# Patient Record
Sex: Male | Born: 1965 | Race: Black or African American | Hispanic: No | Marital: Married | Smoking: Current every day smoker
Health system: Southern US, Community
[De-identification: ages and names within clinical notes are randomized; demographics above are authoritative.]

---

## 2003-04-03 ENCOUNTER — Ambulatory Visit (HOSPITAL_COMMUNITY): Admission: EM | Admit: 2003-04-03 | Discharge: 2003-04-04 | Payer: Self-pay | Admitting: Emergency Medicine

## 2003-04-03 ENCOUNTER — Encounter: Payer: Self-pay | Admitting: *Deleted

## 2003-04-04 ENCOUNTER — Encounter: Payer: Self-pay | Admitting: *Deleted

## 2012-02-16 ENCOUNTER — Telehealth: Payer: Self-pay | Admitting: *Deleted

## 2012-02-16 NOTE — Telephone Encounter (Deleted)
Wife called back stating they were discharged from the ER with erosive stomach ulcer as a diagnosis and to follow up with Dr. Russella Dar.  He had a normal CBC, and chest xray per wife.  They need to know if we need to refer him to Dr. Russella Dar from our office.  Would like to speak to [facility] or Dr. Tawanna Cooler.

## 2012-02-16 NOTE — Telephone Encounter (Signed)
This encounter is not correct. Please disregard.

## 2019-02-12 ENCOUNTER — Other Ambulatory Visit: Payer: Self-pay

## 2019-02-12 ENCOUNTER — Emergency Department (HOSPITAL_COMMUNITY)
Admission: EM | Admit: 2019-02-12 | Discharge: 2019-02-12 | Disposition: A | Discharge status: Home / Self Care | Payer: BC Managed Care – PPO | Attending: Emergency Medicine | Admitting: Emergency Medicine

## 2019-02-12 ENCOUNTER — Emergency Department (HOSPITAL_COMMUNITY): Payer: BC Managed Care – PPO

## 2019-02-12 ENCOUNTER — Encounter (HOSPITAL_COMMUNITY): Payer: Self-pay | Admitting: Emergency Medicine

## 2019-02-12 DIAGNOSIS — L03115 Cellulitis of right lower limb: Secondary | ICD-10-CM | POA: Insufficient documentation

## 2019-02-12 DIAGNOSIS — L0291 Cutaneous abscess, unspecified: Secondary | ICD-10-CM

## 2019-02-12 MED ORDER — DOXYCYCLINE HYCLATE 100 MG PO TABS: 100.0000 mg | ORAL_TABLET | Freq: Two times a day (BID) | ORAL | 0 refills | Status: DC

## 2019-02-12 MED ORDER — LIDOCAINE HCL (PF) 1 % IJ SOLN
5.0000 mL | Freq: Once | INTRAMUSCULAR | Status: AC
  Administered 2019-02-12: 5 mL
  Filled 2019-02-12: qty 5

## 2019-02-12 MED ORDER — NAPROXEN 500 MG PO TABS: 500.0000 mg | ORAL_TABLET | Freq: Two times a day (BID) | ORAL | 0 refills | Status: AC
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
## 2019-02-12 NOTE — Discharge Instructions (Addendum)
Take the antibiotics as prescribed, follow-up with the orthopedic doctor for further evaluation.  Soak your ankle several times a day to continue to promote drainage and wound healing
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
## 2019-08-14 ENCOUNTER — Encounter (HOSPITAL_COMMUNITY): Payer: Self-pay | Admitting: Emergency Medicine

## 2019-08-14 ENCOUNTER — Other Ambulatory Visit: Payer: Self-pay

## 2019-08-14 ENCOUNTER — Telehealth: Payer: Self-pay | Admitting: *Deleted

## 2019-08-14 ENCOUNTER — Emergency Department (HOSPITAL_COMMUNITY)
Admission: EM | Admit: 2019-08-14 | Discharge: 2019-08-14 | Disposition: A | Discharge status: Home / Self Care | Payer: BC Managed Care – PPO | Attending: Emergency Medicine | Admitting: Emergency Medicine

## 2019-08-14 DIAGNOSIS — F17210 Nicotine dependence, cigarettes, uncomplicated: Secondary | ICD-10-CM | POA: Diagnosis not present

## 2019-08-14 DIAGNOSIS — L0291 Cutaneous abscess, unspecified: Secondary | ICD-10-CM | POA: Diagnosis present

## 2019-08-14 DIAGNOSIS — L02415 Cutaneous abscess of right lower limb: Secondary | ICD-10-CM | POA: Diagnosis not present

## 2019-08-14 DIAGNOSIS — L02419 Cutaneous abscess of limb, unspecified: Secondary | ICD-10-CM

## 2019-08-14 LAB — CBC WITH DIFFERENTIAL/PLATELET
Abs Immature Granulocytes: 0.05 10*3/uL (ref 0.00–0.07)
Basophils Absolute: 0 10*3/uL (ref 0.0–0.1)
Basophils Relative: 0 %
Eosinophils Absolute: 0.2 10*3/uL (ref 0.0–0.5)
Eosinophils Relative: 1 %
HCT: 44.1 % (ref 39.0–52.0)
Hemoglobin: 14.9 g/dL (ref 13.0–17.0)
Immature Granulocytes: 0 %
Lymphocytes Relative: 12 %
Lymphs Abs: 2 10*3/uL (ref 0.7–4.0)
MCH: 31.8 pg (ref 26.0–34.0)
MCHC: 33.8 g/dL (ref 30.0–36.0)
MCV: 94 fL (ref 80.0–100.0)
Monocytes Absolute: 1.8 10*3/uL — ABNORMAL HIGH (ref 0.1–1.0)
Monocytes Relative: 11 %
Neutro Abs: 12.3 10*3/uL — ABNORMAL HIGH (ref 1.7–7.7)
Neutrophils Relative %: 76 %
Platelets: 206 10*3/uL (ref 150–400)
RBC: 4.69 MIL/uL (ref 4.22–5.81)
RDW: 14.6 % (ref 11.5–15.5)
WBC: 16.4 10*3/uL — ABNORMAL HIGH (ref 4.0–10.5)
nRBC: 0 % (ref 0.0–0.2)

## 2019-08-14 LAB — COMPREHENSIVE METABOLIC PANEL
ALT: 22 U/L (ref 0–44)
AST: 19 U/L (ref 15–41)
Albumin: 3.3 g/dL — ABNORMAL LOW (ref 3.5–5.0)
Alkaline Phosphatase: 71 U/L (ref 38–126)
Anion gap: 10 (ref 5–15)
BUN: 13 mg/dL (ref 6–20)
CO2: 21 mmol/L — ABNORMAL LOW (ref 22–32)
Calcium: 9.1 mg/dL (ref 8.9–10.3)
Chloride: 106 mmol/L (ref 98–111)
Creatinine, Ser: 1.01 mg/dL (ref 0.61–1.24)
GFR calc Af Amer: >60 mL/min (ref >60)
GFR calc non Af Amer: >60 mL/min (ref >60)
Glucose, Bld: 100 mg/dL — ABNORMAL HIGH (ref 70–99)
Potassium: 4 mmol/L (ref 3.5–5.1)
Sodium: 137 mmol/L (ref 135–145)
Total Bilirubin: 0.7 mg/dL (ref 0.3–1.2)
Total Protein: 6.8 g/dL (ref 6.5–8.1)

## 2019-08-14 LAB — LACTIC ACID, PLASMA: Lactic Acid, Venous: 0.6 mmol/L (ref 0.5–1.9)

## 2019-08-14 MED ORDER — SULFAMETHOXAZOLE-TRIMETHOPRIM 800-160 MG PO TABS: 1.0000 | ORAL_TABLET | Freq: Two times a day (BID) | ORAL | 0 refills | Status: AC

## 2019-08-14 MED ORDER — LIDOCAINE-EPINEPHRINE (PF) 2 %-1:200000 IJ SOLN
10.0000 mL | Freq: Once | INTRAMUSCULAR | Status: AC
  Administered 2019-08-14: 10 mL
  Filled 2019-08-14: qty 20

## 2019-08-14 MED ORDER — HYDROCODONE-ACETAMINOPHEN 5-325 MG PO TABS
1.0000 | ORAL_TABLET | Freq: Once | ORAL | Status: AC
  Administered 2019-08-14: 09:00:00 1 via ORAL
  Filled 2019-08-14: qty 1

## 2019-08-14 NOTE — Discharge Instructions (Signed)
Please read and follow all provided instructions.  Your diagnoses today include:  1. Ankle abscess    Tests performed today include:  Vital signs. See below for your results today.   Medications prescribed:   Bactrim (trimethoprim/sulfamethoxazole) - antibiotic  You have been prescribed an antibiotic medicine: take the entire course of medicine even if you are feeling better. Stopping early can cause the antibiotic not to work.  Take any prescribed medications only as directed.   Home care instructions:   Follow any educational materials contained in this packet  Follow-up instructions: Return to the Emergency Department in 48 hours for a recheck if your symptoms are not significantly improved.  Please follow-up with your primary care provider in the next 1 week for further evaluation of your symptoms.   Return instructions:  Return to the Emergency Department if you have:  Fever  Worsening symptoms  Worsening pain  Worsening swelling  Redness of the skin that moves away from the affected area, especially if it streaks away from the affected area   Any other emergent concerns  Additional Information: If you have recurrent abscesses, try the following. Wash your body with over-the-counter Hibaclens once a day for one week and then once every two weeks. This can reduce the amount of bacterial on your skin that causes boils and lead to fewer boils. If you continue to have multiple or recurrent boils, you should see a dermatologist (skin doctor).   Your vital signs today were: BP 134/70 (BP Location: Right Arm)    Pulse 60    Temp 98.1 F (36.7 C) (Oral)    Resp 16    SpO2 100%  If your blood pressure (BP) was elevated above 135/85 this visit, please have this repeated by your doctor within one month. --------------

## 2019-08-14 NOTE — Telephone Encounter (Signed)
TOC CM received call from pt and states he has a RX for antibiotics. Explained to pt that he will need to take to a retail pharmacy to have medication filled. He has insurance. Sent him information on provider to call and establish with PCP. And he also can go to the [employer] for list of providers. [place] [place] [facility] TOC CM [phone]
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
## 2019-08-14 NOTE — ED Triage Notes (Signed)
C/o large abscess to R lateral ankle since Thursday.  States he had abscess in same location before and did not finish antibiotics because he was allergic to them.
